# Patient Record
Sex: Female | Born: 1980 | Race: White | Hispanic: No | Marital: Married
Health system: Midwestern US, Community
[De-identification: ages and names within clinical notes are randomized; demographics above are authoritative.]

## PROBLEM LIST (undated history)

## (undated) DIAGNOSIS — N644 Mastodynia: Secondary | ICD-10-CM

---

## 2014-04-03 NOTE — L&D Delivery Note (Signed)
PREOPERATIVE DIAGNOSES:?? ??  1.??3513w6d intrauterine pregnancy.  2.?? NRFHT  ????  POSTOPERATIVE DIAGNOSES:?? ??  Same.  ????  PROCEDURE: primary low transverse cesarean section??   ????  SURGEON: Antoine El Hayek       ASSISTANT: Corayma Cashatt  ????  ESTIMATED BLOOD LOSS:?? 500ml    ????  COMPLICATIONS:?? None.?? ??  ????  ANESTHESIA:?? ??Spinal anesthesia    ????  FINDINGS:?? ??Live Born  Sex:  female  Fetal Position:  Cephalic, left occiput anterior  Apgars:  1 minute: 8; 5 minute: 9  Weight:  5 pounds, 12 ounces  Tubes, uterus, ovaries:  normal      ????  DETAILS OF PROCEDURE:    The patient was taken to the operating room where Spinal anesthesia was administered and found to be adequate. Abdomen was prepped   and draped in the normal sterile fashion. Foley catheter had previously been   placed. Pfannenstiel skin incision made with a scalpel, carried down to the fascia with cautery. The fascia was nicked in the midline and extended laterally with   cautery. Muscles were separated in the midline. Peritoneum was grasped with   hemostats, tented up, and entered sharply. Peritoneal incision was extended   superiorly and inferiorly with good visualization of bladder.  Delee bladder blade was introduced. Bladder flap was created with sharp dissection. Low transverse uterine incision was made with a scalpel and extended bluntly. Membranes ruptured for Meconium fluid. A viable female infant was delivered in the cephalic presentation without diffiuclty.  Baby was bulb suctioned on the abdomen. Cord was clamped and cut, and handed to waiting R.N. Cord blood was obtained. Placenta was manually extracted with 3 vessel cord. Uterus was exteriorized, cleared of all clot and debris. Uterine incision   was closed with vicryl in a running locked fashion.  Good hemostasis was   noted. Uterus, tubes, and ovaries appeared normal. Uterus was returned to   the pelvis. The pelvis was irrigated, cleared of all clot and debris.   Fascia was closed with PDS  in a running  fashion. Subcutaneous tissue was irrigated, made hemostatic. Skin was closed with absorbable subcutaneous staples. Baby and mom to recovery room in stable condition. Placenta to pathology: Yes.    Kelly Gray

## 2014-07-09 LAB — CBC
Hematocrit: 40.5 % (ref 34.0–48.0)
Hemoglobin: 13.4 g/dL (ref 11.5–15.5)
MCH: 31.5 pg (ref 26.0–35.0)
MCHC: 33.1 % (ref 32.0–34.5)
MCV: 95 fL (ref 80.0–99.9)
MPV: 8.6 fL (ref 7.0–12.0)
Platelets: 224 E9/L (ref 130–450)
RBC: 4.26 E12/L (ref 3.50–5.50)
RDW: 14 fL (ref 11.5–15.0)
WBC: 8.5 E9/L (ref 4.5–11.5)

## 2014-07-10 LAB — GLUCOSE TOLERANCE OBSTETRIC
Glucose, GTT - 1 Hour: 135 mg/dL
Glucose, GTT - 2 Hour: 98 mg/dL
Glucose, GTT - Fasting: 71 mg/dL

## 2014-07-10 LAB — RPR: RPR: NONREACTIVE

## 2014-07-10 LAB — HIV SCREEN: HIV-1/HIV-2 Ab: NONREACTIVE

## 2014-07-10 LAB — COOMBS TEST, INDIRECT, QUALITATIVE: Antibody Screen: NEGATIVE

## 2014-07-10 LAB — HEPATITIS B SURFACE ANTIGEN: Hep B S Ag Interp: NONREACTIVE

## 2014-07-10 LAB — ABO/RH: ABO/Rh: A POS

## 2014-07-14 LAB — RUBELLA IMMUNE

## 2014-08-23 LAB — STREP B SCREEN

## 2014-08-24 LAB — C. TRACHOMATIS / N. GONORRHOEAE, DNA

## 2014-08-27 LAB — HPV, HIGH RISK: HPV, High Risk: NEGATIVE

## 2014-09-01 ENCOUNTER — Inpatient Hospital Stay
Admit: 2014-09-01 | Discharge: 2014-09-04 | Disposition: A | Discharge status: Home / Self Care | Source: Home / Self Care | Attending: Obstetrics & Gynecology | Admitting: Obstetrics & Gynecology

## 2014-09-01 LAB — CBC
Hematocrit: 38.9 % (ref 34.0–48.0)
Hemoglobin: 13.1 g/dL (ref 11.5–15.5)
MCH: 31.6 pg (ref 26.0–35.0)
MCHC: 33.7 % (ref 32.0–34.5)
MCV: 93.8 fL (ref 80.0–99.9)
MPV: 8.6 fL (ref 7.0–12.0)
Platelets: 220 E9/L (ref 130–450)
RBC: 4.14 E12/L (ref 3.50–5.50)
RDW: 13.8 fL (ref 11.5–15.0)
WBC: 8.3 E9/L (ref 4.5–11.5)

## 2014-09-01 LAB — TYPE AND SCREEN
ABO/Rh: A POS
Antibody Screen: NEGATIVE

## 2014-09-01 MED ORDER — HYDROCODONE-ACETAMINOPHEN 5-325 MG PO TABS
5-325 MG | ORAL | Status: AC | PRN
Start: 2014-09-01 — End: 2014-09-02

## 2014-09-01 MED ORDER — MORPHINE SULFATE (PF) 1 MG/ML IJ SOLN
1 MG/ML | INTRAMUSCULAR | Status: AC
Start: 2014-09-01 — End: ?

## 2014-09-01 MED ORDER — DOCUSATE SODIUM 100 MG PO CAPS
100 MG | Freq: Two times a day (BID) | ORAL | Status: DC
Start: 2014-09-01 — End: 2014-09-04
  Administered 2014-09-02 – 2014-09-04 (×5): 100 mg via ORAL

## 2014-09-01 MED ORDER — CITRIC ACID-SODIUM CITRATE 334-500 MG/5ML PO SOLN
334-500 MG/5ML | ORAL | Status: AC
Start: 2014-09-01 — End: 2014-09-01
  Administered 2014-09-01: 21:00:00 30 via ORAL

## 2014-09-01 MED ORDER — RHO D IMMUNE GLOBULIN 1500 UNITS IM SOSY
1500 units | Freq: Once | INTRAMUSCULAR | Status: DC
Start: 2014-09-01 — End: 2014-09-04

## 2014-09-01 MED ORDER — ONDANSETRON HCL 4 MG/2ML IJ SOLN
4 MG/2ML | INTRAMUSCULAR | Status: AC
Start: 2014-09-01 — End: ?

## 2014-09-01 MED ORDER — FENTANYL CITRATE (PF) 100 MCG/2ML IJ SOLN
100 MCG/2ML | INTRAMUSCULAR | Status: AC
Start: 2014-09-01 — End: ?

## 2014-09-01 MED ORDER — CEFAZOLIN SODIUM-DEXTROSE 2-3 GM-% IV SOLR
2-3 GM-% | INTRAVENOUS | Status: AC
Start: 2014-09-01 — End: 2014-09-01

## 2014-09-01 MED ORDER — CITRIC ACID-SODIUM CITRATE 334-500 MG/5ML PO SOLN
334-500 MG/5ML | Freq: Once | ORAL | Status: AC
Start: 2014-09-01 — End: 2014-09-01

## 2014-09-01 MED ORDER — ONDANSETRON HCL 4 MG/2ML IJ SOLN
4 MG/2ML | Freq: Four times a day (QID) | INTRAMUSCULAR | Status: DC | PRN
Start: 2014-09-01 — End: 2014-09-04
  Administered 2014-09-02: 06:00:00 4 mg via INTRAVENOUS

## 2014-09-01 MED ORDER — NORMAL SALINE FLUSH 0.9 % IV SOLN
0.9 % | INTRAVENOUS | Status: DC | PRN
Start: 2014-09-01 — End: 2014-09-01

## 2014-09-01 MED ORDER — LANOLIN EX OINT
CUTANEOUS | Status: DC | PRN
Start: 2014-09-01 — End: 2014-09-04

## 2014-09-01 MED ORDER — MEASLES, MUMPS & RUBELLA VAC SC INJ
SUBCUTANEOUS | Status: DC
Start: 2014-09-01 — End: 2014-09-04

## 2014-09-01 MED ORDER — LACTATED RINGERS IV SOLN 1000 ML
INTRAVENOUS | Status: DC
Start: 2014-09-01 — End: 2014-09-01
  Administered 2014-09-01: 21:00:00 via INTRAVENOUS

## 2014-09-01 MED ORDER — OXYTOCIN 30 UNITS IN 500 ML INFUSION
30 UNIT/500ML | INTRAVENOUS | Status: DC
Start: 2014-09-01 — End: 2014-09-04

## 2014-09-01 MED ORDER — OXYCODONE-ACETAMINOPHEN 5-325 MG PO TABS
5-325 MG | ORAL | Status: DC | PRN
Start: 2014-09-01 — End: 2014-09-04

## 2014-09-01 MED ORDER — NORMAL SALINE FLUSH 0.9 % IV SOLN
0.9 % | Freq: Two times a day (BID) | INTRAVENOUS | Status: DC
Start: 2014-09-01 — End: 2014-09-04
  Administered 2014-09-02: 13:00:00 10 mL via INTRAVENOUS

## 2014-09-01 MED ORDER — OXYTOCIN 30 UNITS IN 500 ML INFUSION
30 UNIT/500ML | INTRAVENOUS | Status: DC
Start: 2014-09-01 — End: 2014-09-01

## 2014-09-01 MED ORDER — KETOROLAC TROMETHAMINE 30 MG/ML IJ SOLN
30 MG/ML | Freq: Four times a day (QID) | INTRAMUSCULAR | Status: AC
Start: 2014-09-01 — End: 2014-09-02
  Administered 2014-09-02 (×4): 15 mg via INTRAVENOUS

## 2014-09-01 MED ORDER — NORMAL SALINE FLUSH 0.9 % IV SOLN
0.9 % | Freq: Two times a day (BID) | INTRAVENOUS | Status: DC
Start: 2014-09-01 — End: 2014-09-01

## 2014-09-01 MED ORDER — FERROUS SULFATE 325 (65 FE) MG PO TABS
325 (65 Fe) MG | Freq: Two times a day (BID) | ORAL | Status: DC
Start: 2014-09-01 — End: 2014-09-04

## 2014-09-01 MED ORDER — NALOXONE HCL 0.4 MG/ML IJ SOLN
0.4 MG/ML | INTRAMUSCULAR | Status: DC | PRN
Start: 2014-09-01 — End: 2014-09-04

## 2014-09-01 MED ORDER — CEFAZOLIN SODIUM-DEXTROSE 2-3 GM-% IV SOLR
2-3 GM-% | INTRAVENOUS | Status: AC
Start: 2014-09-01 — End: 2014-09-01
  Administered 2014-09-01: 21:00:00 2 via INTRAVENOUS

## 2014-09-01 MED ORDER — NORMAL SALINE FLUSH 0.9 % IV SOLN
0.9 % | INTRAVENOUS | Status: DC | PRN
Start: 2014-09-01 — End: 2014-09-04
  Administered 2014-09-02: 19:00:00 10 mL via INTRAVENOUS

## 2014-09-01 MED ORDER — LACTATED RINGERS IV SOLN
INTRAVENOUS | Status: DC
Start: 2014-09-01 — End: 2014-09-01
  Administered 2014-09-01: 17:00:00 1000 via INTRAVENOUS

## 2014-09-01 MED ORDER — TETANUS-DIPHTH-ACELL PERTUSSIS 5-2.5-18.5 LF-MCG/0.5 IM SUSP
INTRAMUSCULAR | Status: DC
Start: 2014-09-01 — End: 2014-09-04

## 2014-09-01 MED ORDER — ACETAMINOPHEN 325 MG PO TABS
325 MG | ORAL | Status: DC | PRN
Start: 2014-09-01 — End: 2014-09-04

## 2014-09-01 MED ORDER — IBUPROFEN 800 MG PO TABS
800 MG | Freq: Three times a day (TID) | ORAL | Status: DC
Start: 2014-09-01 — End: 2014-09-04
  Administered 2014-09-03 – 2014-09-04 (×5): 800 mg via ORAL

## 2014-09-01 MED FILL — CITRIC ACID-SODIUM CITRATE 334-500 MG/5ML PO SOLN: 334-500 MG/5ML | ORAL | Qty: 30

## 2014-09-01 MED FILL — OXYTOCIN 30 UNITS IN 500 ML INFUSION: 30 UNIT/500ML | INTRAVENOUS | Qty: 500

## 2014-09-01 MED FILL — MORPHINE SULFATE (PF) 1 MG/ML IJ SOLN: 1 MG/ML | INTRAMUSCULAR | Qty: 10

## 2014-09-01 MED FILL — CEFAZOLIN SODIUM-DEXTROSE 2-3 GM-% IV SOLR: 2-3 GM-% | INTRAVENOUS | Qty: 2

## 2014-09-01 MED FILL — FENTANYL CITRATE (PF) 100 MCG/2ML IJ SOLN: 100 MCG/2ML | INTRAMUSCULAR | Qty: 2

## 2014-09-01 MED FILL — ONDANSETRON HCL 4 MG/2ML IJ SOLN: 4 MG/2ML | INTRAMUSCULAR | Qty: 2

## 2014-09-01 NOTE — Progress Notes (Signed)
Dr Neldon NewportEl Hayek notified of decels, will prepare pt for csection.  Plan of care discussed with pt.

## 2014-09-01 NOTE — H&P (Signed)
Department of Obstetrics and Gynecology  Labor and Delivery   Attending Obstetrics History and Physical      Patient Name: Kelly Gray  Date of Birth: 05-26-1980   MRN: [id number]    CHIEF COMPLAINT:  decreased fetal movement    HISTORY OF PRESENT ILLNESS:    The patient is a 34 y.o. female, G2 P1 A0, Estimated Date of Delivery: 09/16/14, EGA: 37 and 6/7 weeks.  Her current obstetrical history is significant for conceived through ivf.     Lab Review  GBS: negative    PAST OB HISTORY  1svd ft   2 current    Past Medical History:    No past medical history on file.   Past Surgical History:    No past surgical history on file.     Medications Prior to Admission:  Prescriptions prior to admission: Prenatal Multivit-Min-Fe-FA (PRENATAL #2 PO), Take 1 capsule by mouth daily  Allergies:  Seasonal    REVIEW OF SYSTEMS:    Constitutional:  negative  Eyes:  negative  Ears, nose, mouth, throat, and face:  negative  Respiratory:  negative  Cardiovascular:  negative  Gastrointestinal:  negative  Genitourinary:  negative  Musculoskeletal:  negative  Neurological:  negative    PHYSICAL EXAM:  Blood pressure 121/81, pulse 70, temperature 98.2 ??F (36.8 ??C), temperature source Oral, resp. rate 16, height 5\' 4"  (1.626 m), weight 148 lb (67.132 kg).     General Appearance: awake, alert, cooperative, no apparent distress, and appears stated age  Heart: regular rate and rhythm and no murmur noted  Abdomen:    Gravid at 37 and 6/7 weeks   Uterine contraction: none   Fetal heart rate:  Baseline heart rate 130, variability: moderate, accelerations: present, decelerations: prolonged, 4 minutes decels to 70 bpm, twice  Pelvic Exam: CD 1 cm, 50 % effaced, posterior, cephalic -3     ASSESSMENT AND PLAN:  34 y.o. female, G2 P1 A0 at 7237 and 6/7 weeks of gestation.  Decreased fetal movement  fhr decelerations    Discussed with Dr Neldon NewportEl Hayek  Admit, monitor, trial induction of labor

## 2014-09-01 NOTE — Progress Notes (Addendum)
Spoke with Elonda HuskyKathie, RT, notified that end tidal CO2 is ordered for patient. She will be here shortly.

## 2014-09-01 NOTE — Progress Notes (Signed)
Comes with complaint of noticing decreased fetal movement since yesterday afternoon  efm applied.

## 2014-09-01 NOTE — Progress Notes (Signed)
Spinal placed with pt in r side lying position due to intolerance of position change of fetus.  fht monitored in OR per nursing during spinal placement and until surgical prep.  FHR 140s with moderate variability during this time.

## 2014-09-01 NOTE — Progress Notes (Signed)
NICU notified of pt to be c-section

## 2014-09-01 NOTE — Progress Notes (Signed)
Dr Neldon NewportEl Hayek notified of decel at 1456.  Tracing prior to decel moderate variability with accels, current modereate variability, pt lying on R side. Dr states ok to start pitocin in 30 minutes.

## 2014-09-01 NOTE — Progress Notes (Signed)
Up to br

## 2014-09-01 NOTE — Anesthesia Pre-Procedure Evaluation (Signed)
Department of Anesthesiology  Preprocedure Note       Name:  Kelly Gray   Age:  34 y.o.  DOB:  1980/11/05                                          MRN:  [id number]         Date:  09/01/2014          Department of Anesthesiology  Pre-Anesthesia Evaluation/Consultation    NAME:  Kelly Gray                                         AGE: 34 y.o.  MRN:    [id number]     Procedure (Scheduled):  Labor Options (spinal vs epidural vs GA)  Surgeon:  Dr. Orvan July, MD     Allergies   Allergen Reactions   ??? Seasonal      There is no problem list on file for this patient.    Past Medical History   Diagnosis Date   ??? Varicosities w/ pregnancy--left leg     Past Surgical History   Procedure Laterality Date   ??? Salpingectomy Left Aug 2011   ??? Salpingectomy Right Jan 2012     History   Substance Use Topics   ??? Smoking status: Never Smoker    ??? Smokeless tobacco: Not on file   ??? Alcohol Use: No     Medications  No current facility-administered medications on file prior to encounter.     No current outpatient prescriptions on file prior to encounter.     Current Facility-Administered Medications   Medication Dose Route Frequency Provider Last Rate Last Dose   ??? lactated ringers infusion   Intravenous Continuous Westley Foots, MD       ??? oxytocin (PITOCIN) 30 units in 500 mL infusion  1 milli-units/min Intravenous Continuous Orvan July, MD       ??? sodium chloride flush 0.9 % injection 10 mL  10 mL Intravenous 2 times per day Orvan July, MD       ??? sodium chloride flush 0.9 % injection 10 mL  10 mL Intravenous PRN Orvan July, MD       ??? citric acid-sodium citrate (BICITRA) solution 30 mL  30 mL Oral Once Orvan July, MD       ??? ceFAZolin (ANCEF) 2 g in dextrose 3% 50mL IVPB (duplex)  2 g Intravenous On Call to OR Orvan July, MD       ??? citric acid-sodium citrate (BICITRA) 334-500 MG/5ML solution            ??? ceFAZolin sodium-dextrose (ANCEF) 2-3 GM-% IVPB (duplex) SOLR              Vital Signs  (Current)   Filed Vitals:    09/01/14 1158   BP:    Pulse:    Temp: 98.2 ??F (36.8 ??C)   Resp:      Vital Signs Statistics (for past 48 hrs)     BP  Min: 121/81   Min taken time: 09/01/14 1154  Max: 121/81   Max taken time: 09/01/14 1154  Temp  Avg: 98.2 ??F (36.8 ??C)  Min: 98.2 ??F (36.8 ??C)   Min taken time: 09/01/14 1158  Max: 98.2 ??F (36.8 ??C)  Max taken time: 09/01/14 1158  Pulse  Avg: 70  Min: 70   Min taken time: 09/01/14 1154  Max: 70   Max taken time: 09/01/14 1154  Resp  Avg: 16  Min: 16   Min taken time: 09/01/14 1154  Max: 16   Max taken time: 09/01/14 1154    BP Readings from Last 3 Encounters:   09/01/14 121/81     BMI  Body mass index is 25.39 kg/(m^2).  Estimated body mass index is 25.39 kg/(m^2) as calculated from the following:    Height as of this encounter:  (1.626 m).    Weight as of this encounter: 148 lb (67.132 kg).    CBC   Lab Results   Component Value Date    WBC 8.3 09/01/2014    RBC 4.14 09/01/2014    HGB 13.1 09/01/2014    HCT 38.9 09/01/2014    MCV 93.8 09/01/2014    RDW 13.8 09/01/2014    PLT 220 09/01/2014     CMP  No results found for: NA, K, CL, CO2, BUN, CREATININE, GFRAA, AGRATIO, LABGLOM, GLUCOSE, PROT, CALCIUM, BILITOT, ALKPHOS, AST, ALT  BMP  No results found for: NA, K, CL, CO2, BUN, CREATININE, CALCIUM, GFRAA, LABGLOM, GLUCOSE  POCGlucose  No results for input(s): GLUCOSE in the last 72 hours.   Coags  No results found for: PROTIME, INR, APTT  HCG (If Applicable) No results found for: PREGTESTUR, PREGSERUM, HCG, HCGQUANT   ABGs No results found for: PHART, PO2ART, PCO2ART, HCO3ART, BEART, O2SATART   Type & Screen (If Applicable)  Lab Results   Component Value Date    ABORH A POS 09/01/2014     Obstetric History    G1   P0   T0   P0   A0   TAB0   SAB0   E0   M0   L0       # Outcome Date GA Lbr Len/2nd Weight Sex Delivery Anes PTL Lv   1 Current                 [redacted]w[redacted]d            Medications prior to admission:   Prior to Admission medications    Medication Sig Start Date End  Date Taking? Authorizing Provider   Prenatal Multivit-Min-Fe-FA (PRENATAL #2 PO) Take 1 capsule by mouth daily   Yes Historical Provider, MD       Current medications:    Current Facility-Administered Medications   Medication Dose Route Frequency Provider Last Rate Last Dose   ??? lactated ringers infusion   Intravenous Continuous Westley Foots, MD       ??? oxytocin (PITOCIN) 30 units in 500 mL infusion  1 milli-units/min Intravenous Continuous Orvan July, MD       ??? sodium chloride flush 0.9 % injection 10 mL  10 mL Intravenous 2 times per day Orvan July, MD       ??? sodium chloride flush 0.9 % injection 10 mL  10 mL Intravenous PRN Orvan July, MD       ??? citric acid-sodium citrate (BICITRA) solution 30 mL  30 mL Oral Once Orvan July, MD       ??? ceFAZolin (ANCEF) 2 g in dextrose 3% 50mL IVPB (duplex)  2 g Intravenous On Call to OR Orvan July, MD       ??? citric acid-sodium citrate (BICITRA) 334-500 MG/5ML solution            ???  ceFAZolin sodium-dextrose (ANCEF) 2-3 GM-% IVPB (duplex) SOLR                Allergies:    Allergies   Allergen Reactions   ??? Seasonal        Problem List:  There is no problem list on file for this patient.      Past Medical History:        Diagnosis Date   ??? Varicosities w/ pregnancy--left leg       Past Surgical History:        Procedure Laterality Date   ??? Salpingectomy Left Aug 2011   ??? Salpingectomy Right Jan 2012       Social History:    History   Substance Use Topics   ??? Smoking status: Never Smoker    ??? Smokeless tobacco: Not on file   ??? Alcohol Use: No                                Counseling given: Not Answered      Vital Signs (Current):   Filed Vitals:    09/01/14 1154 09/01/14 1158   BP: 121/81    Pulse: 70    Temp:  98.2 ??F (36.8 ??C)   TempSrc:  Oral   Resp: 16    Height:   (1.626 m)   Weight:  148 lb (67.132 kg)                                              BP Readings from Last 3 Encounters:   09/01/14 121/81       NPO Status:  since 1200 on  09/01/14                                                                               BMI:   Wt Readings from Last 3 Encounters:   09/01/14 148 lb (67.132 kg)     Body mass index is 25.39 kg/(m^2).    Anesthesia Evaluation  Patient summary reviewed and Nursing notes reviewed history of anesthetic complications (pt states she received epidural for first labor and had back pain near needle insertion site for 6 months after birth of child. We discussed both GA and spinal options and patient agrees to spinal anesthesia):   Airway: Mallampati: II  TM distance: >3 FB   Neck ROM: full  Mouth opening: > = 3 FB Dental: normal exam         Pulmonary:negative ROS and normal exam           Cardiovascular:negative ROS                Beta Blocker:  Not on Beta Blocker   Neuro/Psych:   {neg ROS     GI/Hepatic/Renal: neg ROS          Endo/Other:             Comments: IUP Abdominal:  Anesthesia Plan    ASA 2 - emergent     spinal and general   (Discussed spinal and GA options. Pt states previous labor epidural caused pain for 6 months post birth. Agrees to proceed with spinal anesthesia. )  intravenous induction   Anesthetic plan and risks discussed with patient.    Plan discussed with CRNA and attending.            Everlene FarrierWhitney Smith, RN   09/01/2014          DOS STAFF ADDENDUM:    Patient seen and examined, physical exam updated as needed, chart reviewed.    NPO status confirmed.    Anesthetic options and risks discussed with patient/legal guardian.  Patient/legal guardian verbalized understanding and agrees to proceed.     Sarina IllKeith A Cashawn Yanko, MD  Staff Anesthesiologist  Sep 01, 2014  5:19 PM

## 2014-09-01 NOTE — Progress Notes (Signed)
Assume pt care at this time, upon entering room, decel noted on tracing pt on R side, turned to L side IVF increase, returned pt to Right side.

## 2014-09-01 NOTE — Progress Notes (Signed)
Pt transferred to mom baby unit. Paperwork reviewed. education given on safe sleep and  Shaken baby.  Call light with in reach instructed to call for assistance

## 2014-09-02 LAB — HEMOGLOBIN: Hemoglobin: 12.2 g/dL (ref 11.5–15.5)

## 2014-09-02 MED FILL — NORMAL SALINE FLUSH 0.9 % IV SOLN: 0.9 % | INTRAVENOUS | Qty: 10

## 2014-09-02 MED FILL — KETOROLAC TROMETHAMINE 30 MG/ML IJ SOLN: 30 MG/ML | INTRAMUSCULAR | Qty: 1

## 2014-09-02 MED FILL — DOCUSATE SODIUM 100 MG PO CAPS: 100 MG | ORAL | Qty: 1

## 2014-09-02 MED FILL — ONDANSETRON HCL 4 MG/2ML IJ SOLN: 4 MG/2ML | INTRAMUSCULAR | Qty: 2

## 2014-09-02 NOTE — Progress Notes (Signed)
foley cath removed. Pt up to bathroom. peri care done and pads changed. Tolerated well. DTV

## 2014-09-02 NOTE — Plan of Care (Signed)
Problem: Urinary Retention:  Goal: Urinary elimination within specified parameters  Urinary elimination within specified parameters   Outcome: Met This Shift

## 2014-09-02 NOTE — Other (Addendum)
Patient Acct Nbr:  [id number]  Primary AUTH/CERT:    Primary Insurance Company Name:   SELF PAY  Primary Insurance Plan Name:  SELF PAY SPECIAL DELIVERY PACKAGE  Primary Insurance Group Number:    Primary Insurance Plan Type: C  Primary Insurance Policy Number:  [id number]

## 2014-09-02 NOTE — Progress Notes (Signed)
Progress Note    SUBJECTIVE: patient status post c section delivery day 1 doing well , normal postpartum course.  Accepted level of pain    OBJECTIVE:    VITALS:  BP 110/64 mmHg   Pulse 56   Temp(Src) 98 ??F (36.7 ??C) (Oral)   Resp 14   Ht 5\' 4"  (1.626 m)   Wt 148 lb (67.132 kg)   BMI 25.39 kg/m2   SpO2 97%   Breastfeeding? Unknown  Physical Exam  lung:cta  Heart : regular rythm  Abdomen:soft  Appropriate tendernessr ,firm uterus ,bs present,incision clear and dry.  Extremities :normal no evidence of DVT  DATA:  CBC:   Lab Results   Component Value Date    WBC 8.3 09/01/2014    RBC 4.14 09/01/2014    HGB 12.2 09/02/2014    HCT 38.9 09/01/2014    MCV 93.8 09/01/2014    MCH 31.6 09/01/2014    MCHC 33.7 09/01/2014    RDW 13.8 09/01/2014    PLT 220 09/01/2014    MPV 8.6 09/01/2014       ASSESSMENT AND PLAN:  There is no problem list on file for this patient.      Normal post partum care   Anticipate discharge home in  48 hours

## 2014-09-02 NOTE — Other (Signed)
Up to BR with help, denies dizziness, ambulates well.  Voided qs and instructed in peri care.  Ambulating in room.

## 2014-09-02 NOTE — Progress Notes (Deleted)
Pt foley cath removed. Pt up to bathroom . peri care done pads changed . Pt tolerated well. DTV

## 2014-09-02 NOTE — Progress Notes (Signed)
Department of Anesthesiology  Post-Anesthesia Note - Obstetrics    Name:  Kelly Gray                                         Age:  34 y.o.  MRN:  [id number]       Baby:     Information for the patient's newborn:  Erick BlinksShufani, Boy Yassmin [[id number]][[id number]]   Sex: Female  Birthweight: (!) 5 lb 12 oz (2.608 kg)  One Minute Apgar: 8  Five Minute Apgar: 9  Information for the patient's newborn:  Erick BlinksShufani, Boy Gargi [[id number]][[id number]]   Delivery Summary  Delivery Date: 09/01/14  Delivery Time: 1739  Presentation: Vertex  Presenting Part: Occiput  Facing: Anterior  Delivery Method: C-Section, Low Transverse  Band #: [id number]  Security Tag  ID#: [id number]  Sex: Female  Birthweight: (!) 5 lb 12 oz (2.608 kg)  Weight - Scale: 5 lb 12.4 oz (2.62 kg)  Height: 18.9" (48 cm)  Head Circumference (cm): 34 cm      Mom:  Last Vitals:  BP 130/74 mmHg   Pulse 70   Temp(Src) 98.6 ??F (37 ??C) (Oral)   Resp 16   Ht 5\' 4"  (1.626 m)   Wt 148 lb (67.132 kg)   BMI 25.39 kg/m2   SpO2 97%   Breastfeeding? Unknown  No data found.        Level of Consciousness:  Awake    Respiratory:  Stable    Oxygen Saturation:  Stable    Cardiovascular:  Stable    Hydration:  Adequate    PONV:  Stable    Post-op Pain:  Adequate analgesia    Post-op Assessment:  No apparent anesthetic complications    Regional:  Spinal Duramorph  Denies residual numbness or motor weakness.  Denies tenderness at insertion site.   Denies headache.  Has fully recovered from effects of regional anesthesia. yes    Additional Follow-Up / Treatment / Comment:  None      Bluford MainLuz Danelly Cantrell Martus, CRNA  September 02, 2014   8:41 PM

## 2014-09-02 NOTE — Lactation Note (Signed)
Pt has small blister on R nipple, using lansinoh. States she is experienced breastfeeder and doesn't want a breast pump.

## 2014-09-02 NOTE — Other (Signed)
Coverlet dressing removed, steri strips intact with old bloody drainage, covered with loose 4x4s.  Incision well approximated.

## 2014-09-03 ENCOUNTER — Encounter: Attending: Obstetrics & Gynecology

## 2014-09-03 MED FILL — IBUPROFEN 800 MG PO TABS: 800 MG | ORAL | Qty: 1

## 2014-09-03 MED FILL — DOCUSATE SODIUM 100 MG PO CAPS: 100 MG | ORAL | Qty: 1

## 2014-09-03 NOTE — Other (Signed)
Assessment as charted. Ambulation encouraged. Instructed to call for any needs. Feeding baby every 2-3 hrs and supplementing. Medicated for discomfort.

## 2014-09-03 NOTE — Other (Signed)
Infant sleeping in bed with mother. Infant placed back in crib. Mother educated on safe sleep practices for infant. Verbal understanding given.

## 2014-09-03 NOTE — Progress Notes (Signed)
Progress Note    SUBJECTIVE: patient status post c section delivery day 2 doing well , normal postpartum course.  Accepted level of pain    OBJECTIVE:    VITALS:  BP 125/68 mmHg   Pulse 78   Temp(Src) 98.7 ??F (37.1 ??C) (Oral)   Resp 16   Ht 5\' 4"  (1.626 m)   Wt 148 lb (67.132 kg)   BMI 25.39 kg/m2   SpO2 97%   Breastfeeding? Yes  Physical Exam  lung:cta  Heart : regular rythm  Abdomen:soft  Appropriate tendernessr ,firm uterus ,bs present,incision clear and dry.  Extremities :normal no evidence of DVT  DATA:  CBC:   Lab Results   Component Value Date    WBC 8.3 09/01/2014    RBC 4.14 09/01/2014    HGB 12.2 09/02/2014    HCT 38.9 09/01/2014    MCV 93.8 09/01/2014    MCH 31.6 09/01/2014    MCHC 33.7 09/01/2014    RDW 13.8 09/01/2014    PLT 220 09/01/2014    MPV 8.6 09/01/2014       ASSESSMENT AND PLAN:  There is no problem list on file for this patient.      Normal post partum care   Anticipate discharge home in  24 hours

## 2014-09-03 NOTE — Plan of Care (Signed)
Problem: Infection - Surgical Site:  Goal: Will show no infection signs and symptoms  Will show no infection signs and symptoms   Outcome: Met This Shift    Problem: Mood - Altered:  Goal: Mood stable  Mood stable   Outcome: Met This Shift

## 2014-09-03 NOTE — Other (Signed)
Abdomen soft, states she's passing gas.  Increased fluids and ambulation encouraged.

## 2014-09-03 NOTE — Plan of Care (Signed)
Problem: Pain - Acute:  Goal: Pain level will decrease  Pain level will decrease   Outcome: Met This Shift    Problem: Infection - Surgical Site:  Goal: Will show no infection signs and symptoms  Will show no infection signs and symptoms   Outcome: Met This Shift    Problem: Mood - Altered:  Goal: Mood stable  Mood stable   Outcome: Met This Shift    Problem: Nausea/Vomiting:  Goal: Absence of nausea/vomiting  Absence of nausea/vomiting   Outcome: Met This Shift    Problem: Venous Thromboembolism:  Goal: Will show no signs or symptoms of venous thromboembolism  Will show no signs or symptoms of venous thromboembolism   Outcome: Met This Shift

## 2014-09-03 NOTE — Lactation Note (Signed)
This note was copied from the chart of Kelly Gray.  Experienced breastfeeding mother (14 months) reports that baby is latching and feeding well.  Was asked to supplement by doctor d/t low number of bowel movements.  Mother plans to supplement 13 mL after each feed until her mature milk comes in. Gave an hand pump for home.  Reviewed expected intake and output for baby.  Referred to  Lactation Dept phone numbers for questions/support as needed.

## 2014-09-04 MED ORDER — HYDROCODONE-ACETAMINOPHEN 7.5-325 MG PO TABS
ORAL_TABLET | Freq: Four times a day (QID) | ORAL | Status: AC | PRN
Start: 2014-09-04 — End: 2021-10-24

## 2014-09-04 MED FILL — DOCUSATE SODIUM 100 MG PO CAPS: 100 MG | ORAL | Qty: 1

## 2014-09-04 MED FILL — LANSINOH/BREASTFEEDING MOTHERS EX OINT: CUTANEOUS | Qty: 7

## 2014-09-04 MED FILL — IBUPROFEN 800 MG PO TABS: 800 MG | ORAL | Qty: 1

## 2014-09-04 NOTE — Lactation Note (Signed)
This note was copied from the chart of Kelly Gray.  Mother supplementing with formula as per doctor's recommendation for jaundice and low number of bowel movements.  Mother is experienced with breastfeeding (14 months) and doing well with feedings.  Nursing frequency and duration is adequate.  Encouraged to feed by 3 hours if baby has not shown hunger cues. Stressed  the importance of feeding a minimum of 8 times per day.  Encouraged to wake, undress and stimulate baby to eat by 3 hours or sooner if showing hunger cues.  Reviewed importance of frequent feeding and stooling to remove excess bilirubin and prevent jaundice.  Gave hand pump.

## 2014-09-04 NOTE — Plan of Care (Signed)
Problem: Pain - Acute:  Goal: Pain level will decrease  Pain level will decrease   Outcome: Ongoing  Pain will continue to improve.

## 2014-09-04 NOTE — Progress Notes (Signed)
Discharge instructions given, patient verbalized understanding. Patient stated her ride will be here at 6pm. Instructions given on baby as well.  mother also verbalized understanding of those instructions.

## 2014-09-04 NOTE — Other (Signed)
Explained to patient that the doctor encourages her to supplement with formula, but patient refuses to supplement.

## 2014-09-04 NOTE — Plan of Care (Signed)
Problem: Infection - Surgical Site:  Goal: Will show no infection signs and symptoms  Will show no infection signs and symptoms   Outcome: Met This Shift    Problem: Venous Thromboembolism:  Goal: Will show no signs or symptoms of venous thromboembolism  Will show no signs or symptoms of venous thromboembolism   Outcome: Met This Shift  Goal: Absence of signs or symptoms of impaired coagulation  Absence of signs or symptoms of impaired coagulation   Outcome: Met This Shift

## 2014-09-04 NOTE — Discharge Instructions (Signed)
Good nutrition is important when healing from an illness, injury, or surgery. Follow any nutrition recommendations given to you during the hospital stay. If you are instructed to take an oral nutrition supplement at home, you can take it with meals, in-between meals, and/or before bedtime. These supplements can be purchased at most local grocery stores, pharmacies, and chain super-stores. If you need help in covering the cost of your medication or oral supplement, visit www.RxAssist.org. If you have any questions about your diet or nutrition, call the hospital and ask for the dietitian.       Your nutrition orders at the time of discharge were:  DIET GENERAL;.

## 2014-09-04 NOTE — Progress Notes (Signed)
Pt discharged off unit in wheelchair, bands verified and hugs tag removed.

## 2014-09-17 ENCOUNTER — Encounter: Admit: 2014-09-17 | Discharge: 2014-09-17 | Attending: Obstetrics & Gynecology

## 2014-09-30 NOTE — Discharge Summary (Signed)
Obstetric Discharge Summary    Admitting Diagnosis  IUP 38 weeks  OB History     Gravida Para Term Preterm AB TAB SAB Ectopic Multiple Living    4 2 2       2           Reasons for Admission on 09/01/2014 11:33 AM  38WKS MONITORING  No comment available  Onset of Labor    Prenatal Procedures  None    Intrapartum Procedures                 Cesarean Delivery: Cesarean: Low Cervical, Transverse       Postpartum Procedures  None    Postpartum/Operative Complications  Complications: None    Newborn Data  Information for the patient's newborn:  Raelene BottShufani, Mark [[id number]][[id number]]   female  Birth Weight: 5 lb 12 oz (2.608 kg)    Discharge With Mother  Complications: No    Discharge Diagnosis  Surgical Procedures: Cesarean Section    Discharge Information  Discharge Medication List as of 09/04/2014  2:59 PM      START taking these medications    Details   HYDROcodone-acetaminophen (NORCO) 7.5-325 MG per tablet Take 1 tablet by mouth every 6 hours as needed for Pain, Disp-30 tablet, R-0         CONTINUE these medications which have NOT CHANGED    Details   Prenatal Multivit-Min-Fe-FA (PRENATAL #2 PO) Take 1 capsule by mouth daily             No discharge procedures on file.    Discharge to: Home  Follow up in 2 weeks a      Comments

## 2014-10-20 ENCOUNTER — Encounter: Attending: Obstetrics & Gynecology

## 2014-10-22 ENCOUNTER — Encounter: Attending: Obstetrics & Gynecology

## 2021-10-24 ENCOUNTER — Ambulatory Visit: Admit: 2021-10-24 | Discharge: 2021-10-24 | Attending: Obstetrics & Gynecology

## 2021-10-24 DIAGNOSIS — N644 Mastodynia: Secondary | ICD-10-CM

## 2021-10-24 NOTE — Progress Notes (Signed)
[address]  [place], [place] [address]  Phone:  [phone]  Fax:  [phone]    Subjective:     Kelly Gray is a 41 y.o. female    Chief Complaint   Patient presents with    New Patient    Breast Pain    Breast Mass       Breast Pain  Pertinent negatives include no abdominal pain, arthralgias, chest pain, chills, fever, myalgias, nausea or vomiting.   Kelly Gray is an 41 y.o. female who presents for evaluation of her breast pain.Change was noted 1 month ago, and has been unchanged since first identified. Patient does routinely do self breast exams. The pain does changeduring menstrual cycle. The Left breast is tender. Patient denies nipple discharge. Breast cancer risk factors include: noe.    Past Medical History:   Diagnosis Date    Thyroid disease     Varicosities w/ pregnancy--left leg     Past Surgical History:   Procedure Laterality Date    SALPINGECTOMY Left Aug 2011    SALPINGECTOMY Right Jan 2012     Social History     Socioeconomic History    Marital status: Married     Spouse name: Not on file    Number of children: Not on file    Years of education: Not on file    Highest education level: Not on file   Occupational History    Not on file   Tobacco Use    Smoking status: Never    Smokeless tobacco: Never   Vaping Use    Vaping Use: Never used   Substance and Sexual Activity    Alcohol use: No    Drug use: No    Sexual activity: Yes     Partners: Male   Other Topics Concern    Not on file   Social History Narrative    Not on file     Social Determinants of Health     Financial Resource Strain: Not on file   Food Insecurity: Not on file   Transportation Needs: Not on file   Physical Activity: Not on file   Stress: Not on file   Social Connections: Not on file   Intimate Partner Violence: Not on file   Housing Stability: Not on file     Family History   Problem Relation Age of Onset    Kidney Disease Brother         BORN WITH 1 KIDNEY     Allergies:  Seasonal    Review of Systems    Constitutional:  Negative for appetite change, chills, fever and unexpected weight change.   Cardiovascular:  Negative for chest pain and palpitations.   Gastrointestinal:  Negative for abdominal pain, diarrhea, nausea and vomiting.   Genitourinary:  Negative for menstrual problem.   Musculoskeletal:  Negative for arthralgias and myalgias.   All other systems reviewed and are negative.  Breast ROS: positive for - breast tenderness /nipple   negative for - galactorrhea, new or changing breast lumps, nipple changes, or nipple discharge    Objective:     BP 132/78   Pulse 84   Wt 131 lb (59.4 kg)   BMI 22.49 kg/m     Physical Exam  Constitutional:       Appearance: She is well-developed.   HENT:      Head: Normocephalic.   Cardiovascular:      Rate and Rhythm: Normal rate and regular rhythm.  Pulmonary:      Effort: Pulmonary effort is normal. No respiratory distress.      Breath sounds: Normal breath sounds.   Chest:   Breasts:     Right: No swelling, bleeding, inverted nipple, mass, nipple discharge, skin change or tenderness.      Left: Tenderness present. No swelling, bleeding, inverted nipple, mass, nipple discharge or skin change.   Abdominal:      Palpations: Abdomen is soft.       Assessment:      Diagnosis Orders   1. Breast pain  MAM DIGITAL DIAGNOSTIC W OR WO CAD BILATERAL    US BREAST LIMITED LEFT      2. Nipple pain  MAM DIGITAL DIAGNOSTIC W OR WO CAD BILATERAL    US BREAST LIMITED LEFT          Plan:     Orders Placed This Encounter   Procedures    MAM DIGITAL DIAGNOSTIC W OR WO CAD BILATERAL     Standing Status:   Future     Standing Expiration Date:   12/26/2022    US BREAST LIMITED LEFT     Standing Status:   Future     Standing Expiration Date:   10/25/2022     No orders of the defined types were placed in this encounter.      Return in about 4 weeks (around 11/21/2021) for f/u results.      Orvan July, MD  10/24/21, 3:11 PM EDT

## 2021-10-26 ENCOUNTER — Encounter

## 2021-10-26 ENCOUNTER — Inpatient Hospital Stay: Admit: 2021-10-26 | Payer: PRIVATE HEALTH INSURANCE

## 2021-10-26 DIAGNOSIS — N644 Mastodynia: Secondary | ICD-10-CM

## 2023-02-06 IMAGING — MR MRI BREAST BILATERAL W/WO CONTRAST
6 of 13 series · 17 of 48 positions shown · IV contrast (gadolinium)
Comparison: None

________________________________________________________________________________________________ 
MRI BREAST BILATERAL W/WO CONTRAST, 02/06/2023 [DATE]: 
CLINICAL INDICATION: Gray nipple discharge bilaterally. Left breast pain for one 
year. By report previous benign mammogram findings in May 2022.
TECHNIQUE: Multiple sequences were obtained in various planes with both before 
and after the intravenous administration of gadolinium. In addition to the 
routine images, three-dimensional renderings were performed on an independent 
workstation, time activity curves generated over areas of enhancement and 
computer-aided detection utilized. 5.5 mL of Gadavist were injected 
intravenously. 2 mL of Gadavist discarded. Patient was scanned on a 1.5T magnet.

[Series 103: patient aligned mpr · axial · 12.0mm · 3.40mm/px · 1 of 4 slices shown]
[im 1/4]
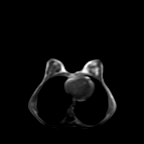

[Series 201: survey · axial · 10.0mm · 1.76mm/px · 1 of 35 slices shown]
[im 1/35]
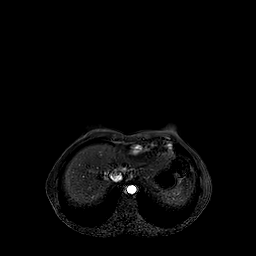

[Series 301: t1w_ffe_3d_cs · axial · 1.6mm · 0.39mm/px · z∈[-132,+67]mm · 5 of 250 slices shown]
[im 1/250]
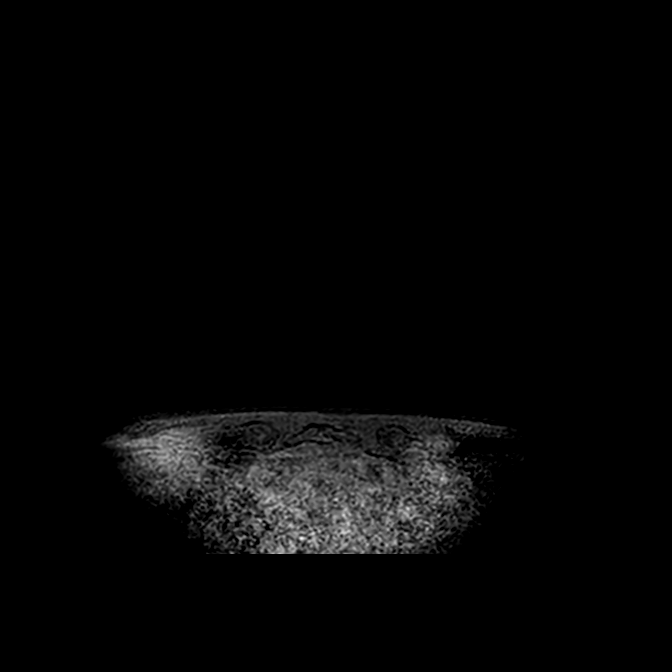
[im 63/250]
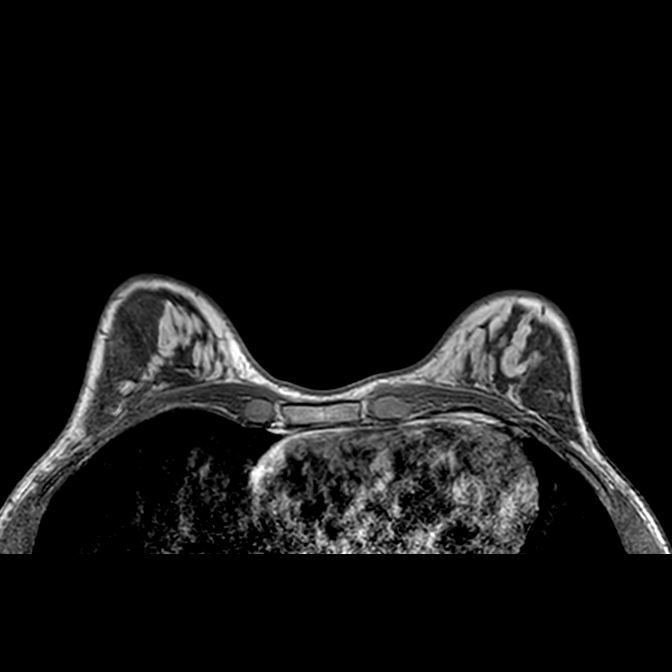
[im 125/250]
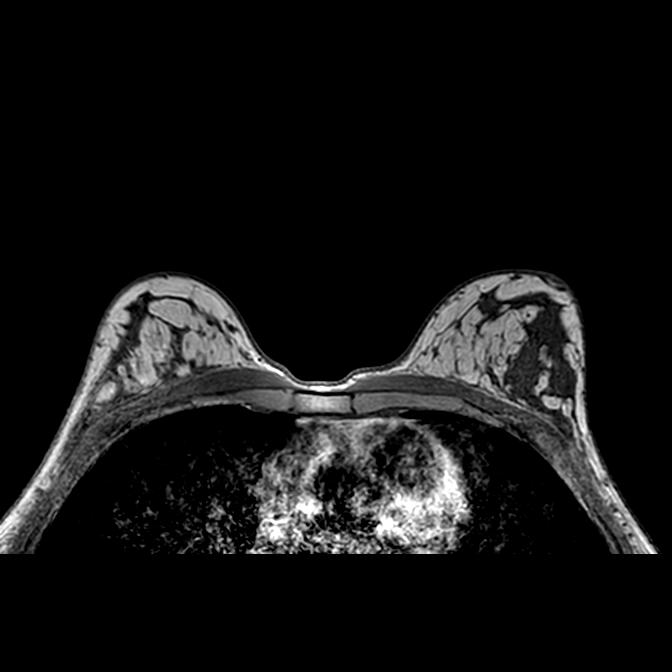
[im 187/250]
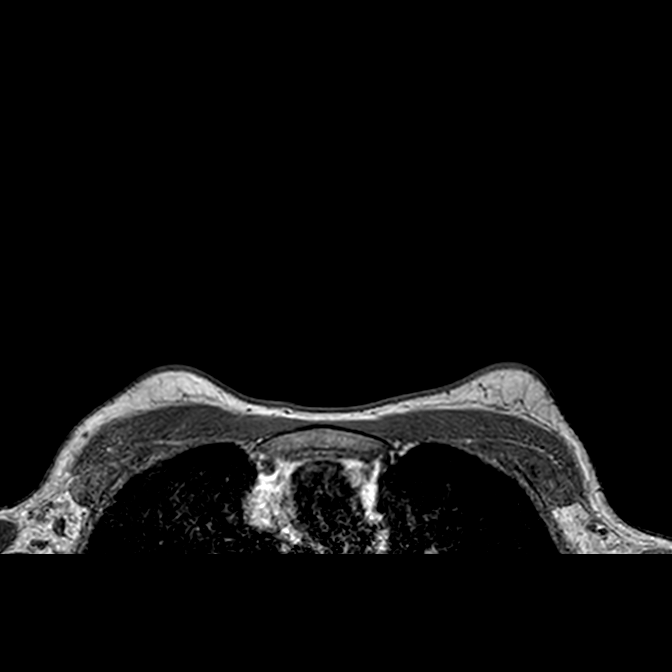
[im 250/250]
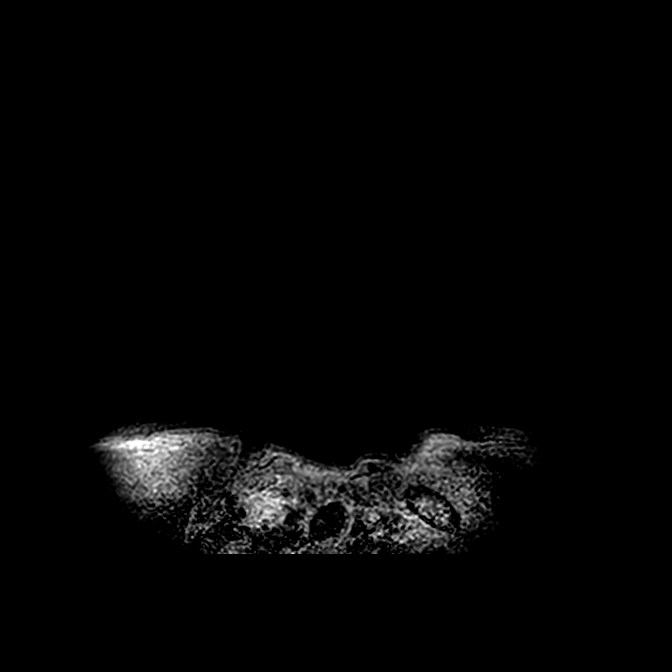

[Series 401: 3d_[id number] · axial · 2.5mm · 0.42mm/px · z∈[-132,+67]mm · 3 of 160 slices shown]
[im 1/160]
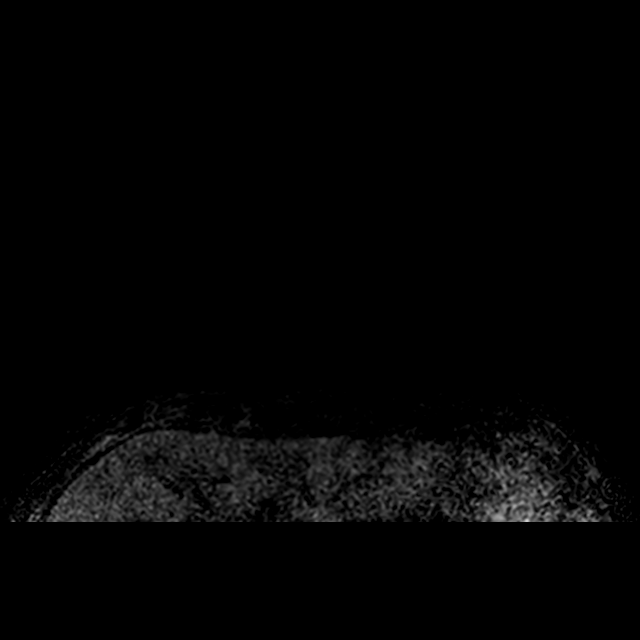
[im 80/160]
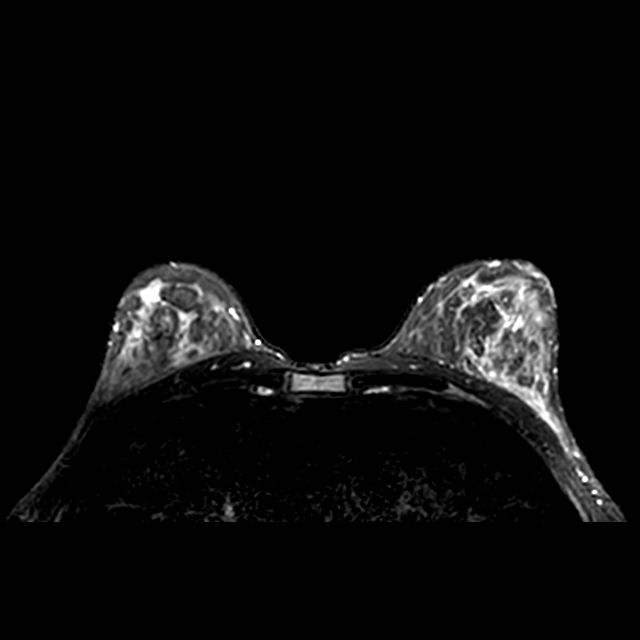
[im 160/160]
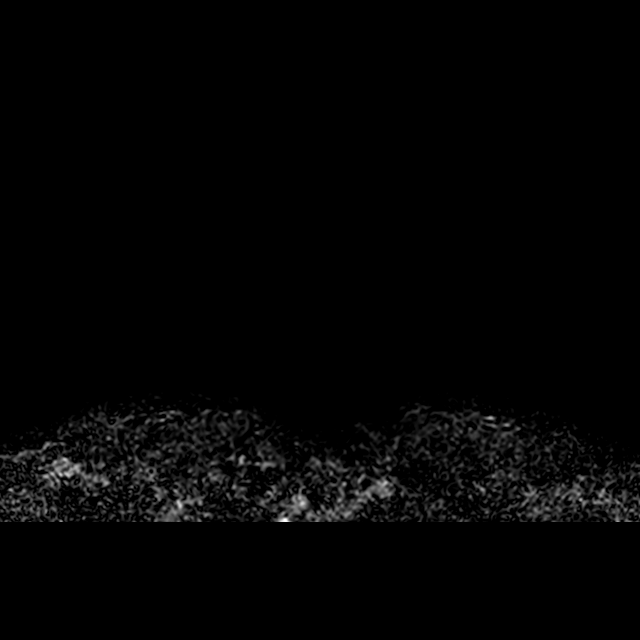

[Series 502: T1 dynamic fat-sat · axial · 2.0mm · 0.56mm/px · z∈[-132,+67]mm · 4 of 200 slices shown]
[im 1/200]
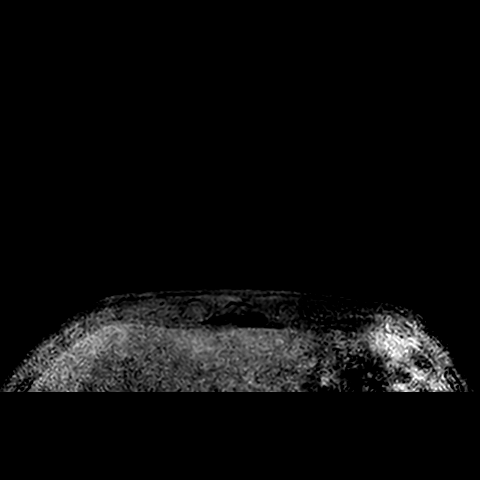
[im 67/200]
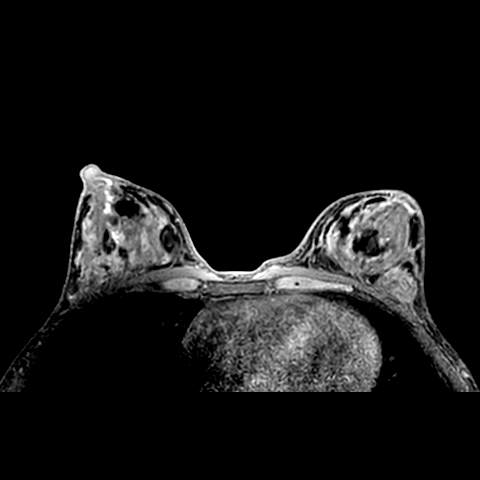
[im 133/200]
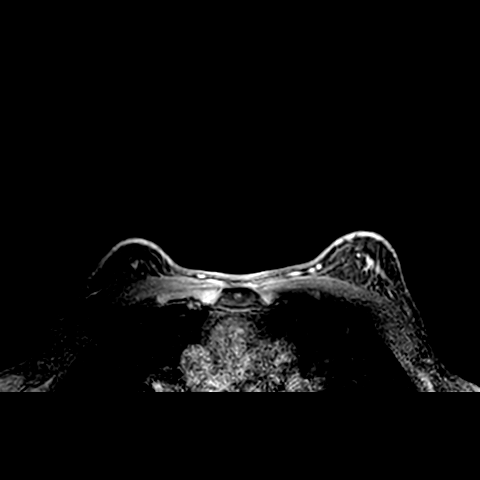
[im 200/200]
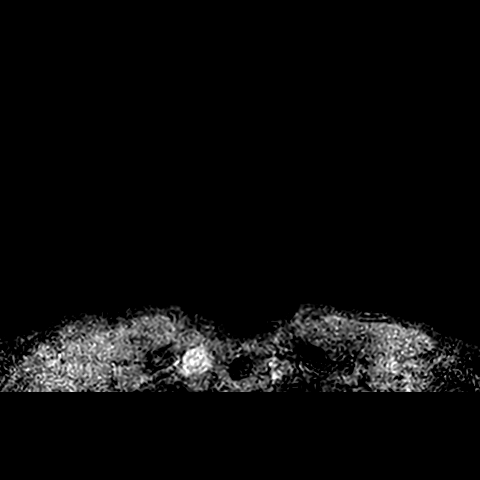

[Series 503: T1 dynamic fat-sat post-contrast · axial · 2.0mm · 0.56mm/px · z∈[-132,-0]mm · 3 of 200 slices shown]
[im 1/200]
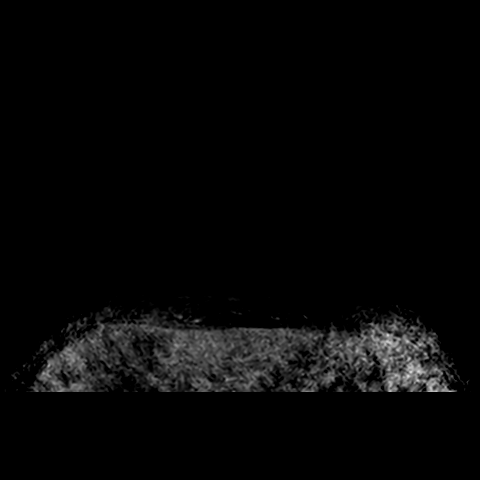
[im 67/200]
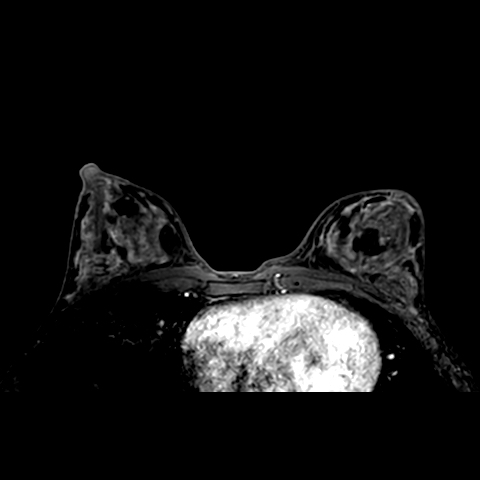
[im 133/200]
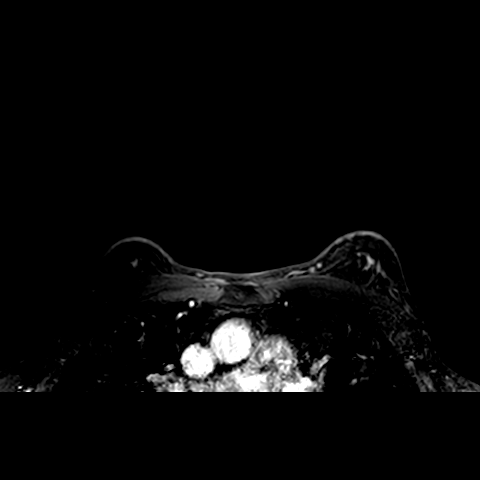

[17 of 48 positions shown; findings below may reference images not displayed]

FINDINGS: Minimal background parenchymal enhancement. 
Right breast: No abnormal areas of enhancement. No areas of enhancement meeting 
threshold criteria on CAD analysis. Small nonspecific right axillary lymph 
nodes.  No internal mammary nodes. 
Left breast: No abnormal areas of enhancement. No areas of enhancement meeting 
threshold criteria on CAD analysis. Small nonspecific left axillary lymph nodes. 
 No internal mammary nodes.
IMPRESSION: No suspicious abnormality identified. No abnormal enhancement or adenopathy 
seen. 
(BI-RADS 2) Benign findings. Routine mammographic follow-up is recommended.
